# Patient Record
Sex: Male | Born: 1935 | Race: White | Hispanic: No | Marital: Married | Smoking: Never smoker
Health system: Southern US, Community
[De-identification: ages and names within clinical notes are randomized; demographics above are authoritative.]

## PROBLEM LIST (undated history)

## (undated) DIAGNOSIS — I10 Essential (primary) hypertension: Secondary | ICD-10-CM

## (undated) HISTORY — PX: APPENDECTOMY: SHX54

## (undated) HISTORY — PX: EYE SURGERY: SHX253

---

## 2014-01-09 ENCOUNTER — Emergency Department (HOSPITAL_COMMUNITY): Payer: Medicare Other

## 2014-01-09 ENCOUNTER — Encounter (HOSPITAL_COMMUNITY)
Admission: EM | Disposition: A | Discharge status: Home / Self Care | Payer: Self-pay | Source: Home / Self Care | Attending: Neurological Surgery

## 2014-01-09 ENCOUNTER — Inpatient Hospital Stay (HOSPITAL_COMMUNITY)
Admission: EM | Admit: 2014-01-09 | Discharge: 2014-01-11 | DRG: 520 | Disposition: A | Discharge status: Home / Self Care | Payer: Medicare Other | Attending: Neurological Surgery | Admitting: Neurological Surgery

## 2014-01-09 ENCOUNTER — Inpatient Hospital Stay: Admit: 2014-01-09 | Payer: Self-pay | Admitting: Neurological Surgery

## 2014-01-09 ENCOUNTER — Emergency Department (HOSPITAL_COMMUNITY): Payer: Medicare Other | Admitting: Anesthesiology

## 2014-01-09 ENCOUNTER — Encounter (HOSPITAL_COMMUNITY): Payer: Medicare Other | Admitting: Anesthesiology

## 2014-01-09 ENCOUNTER — Encounter (HOSPITAL_COMMUNITY): Payer: Self-pay | Admitting: Emergency Medicine

## 2014-01-09 DIAGNOSIS — M5416 Radiculopathy, lumbar region: Secondary | ICD-10-CM

## 2014-01-09 DIAGNOSIS — IMO0002 Reserved for concepts with insufficient information to code with codable children: Secondary | ICD-10-CM

## 2014-01-09 DIAGNOSIS — Z79899 Other long term (current) drug therapy: Secondary | ICD-10-CM

## 2014-01-09 DIAGNOSIS — M4800 Spinal stenosis, site unspecified: Secondary | ICD-10-CM

## 2014-01-09 DIAGNOSIS — I10 Essential (primary) hypertension: Secondary | ICD-10-CM | POA: Diagnosis present

## 2014-01-09 DIAGNOSIS — M5126 Other intervertebral disc displacement, lumbar region: Principal | ICD-10-CM | POA: Diagnosis present

## 2014-01-09 DIAGNOSIS — R29898 Other symptoms and signs involving the musculoskeletal system: Secondary | ICD-10-CM | POA: Diagnosis present

## 2014-01-09 DIAGNOSIS — Z9889 Other specified postprocedural states: Secondary | ICD-10-CM

## 2014-01-09 HISTORY — PX: LUMBAR LAMINECTOMY/DECOMPRESSION MICRODISCECTOMY: SHX5026

## 2014-01-09 HISTORY — DX: Essential (primary) hypertension: I10

## 2014-01-09 LAB — URINALYSIS, ROUTINE W REFLEX MICROSCOPIC
Bilirubin Urine: NEGATIVE
GLUCOSE, UA: NEGATIVE mg/dL
Hgb urine dipstick: NEGATIVE
KETONES UR: NEGATIVE mg/dL
NITRITE: POSITIVE — AB
Protein, ur: NEGATIVE mg/dL
Specific Gravity, Urine: 1.008 (ref 1.005–1.030)
UROBILINOGEN UA: 0.2 mg/dL (ref 0.0–1.0)
pH: 7 (ref 5.0–8.0)

## 2014-01-09 LAB — CBC
HCT: 45.5 % (ref 39.0–52.0)
Hemoglobin: 15.6 g/dL (ref 13.0–17.0)
MCH: 31.4 pg (ref 26.0–34.0)
MCHC: 34.3 g/dL (ref 30.0–36.0)
MCV: 91.5 fL (ref 78.0–100.0)
Platelets: 158 10*3/uL (ref 150–400)
RBC: 4.97 MIL/uL (ref 4.22–5.81)
RDW: 13.1 % (ref 11.5–15.5)
WBC: 8.4 10*3/uL (ref 4.0–10.5)

## 2014-01-09 LAB — BASIC METABOLIC PANEL
BUN: 19 mg/dL (ref 6–23)
CHLORIDE: 103 meq/L (ref 96–112)
CO2: 25 mEq/L (ref 19–32)
Calcium: 9.3 mg/dL (ref 8.4–10.5)
Creatinine, Ser: 0.94 mg/dL (ref 0.50–1.35)
GFR, EST AFRICAN AMERICAN: 89 mL/min — AB (ref >90)
GFR, EST NON AFRICAN AMERICAN: 77 mL/min — AB (ref >90)
Glucose, Bld: 112 mg/dL — ABNORMAL HIGH (ref 70–99)
POTASSIUM: 4.8 meq/L (ref 3.7–5.3)
SODIUM: 143 meq/L (ref 137–147)

## 2014-01-09 LAB — URINE MICROSCOPIC-ADD ON

## 2014-01-09 LAB — I-STAT TROPONIN, ED: TROPONIN I, POC: 0 ng/mL (ref 0.00–0.08)

## 2014-01-09 SURGERY — LUMBAR LAMINECTOMY/DECOMPRESSION MICRODISCECTOMY 2 LEVELS
Anesthesia: General | Site: Back | Laterality: Bilateral

## 2014-01-09 MED ORDER — CEFTRIAXONE SODIUM 1 G IJ SOLR
1.0000 g | Freq: Once | INTRAMUSCULAR | Status: AC
  Administered 2014-01-09: 1 g via INTRAMUSCULAR
  Filled 2014-01-09: qty 10

## 2014-01-09 MED ORDER — LIDOCAINE HCL (PF) 1 % IJ SOLN
INTRAMUSCULAR | Status: AC
  Administered 2014-01-09: 2.1 mL
  Filled 2014-01-09: qty 5

## 2014-01-09 MED ORDER — ACETAMINOPHEN 650 MG RE SUPP: 650.0000 mg | RECTAL | Status: DC | PRN

## 2014-01-09 MED ORDER — EPHEDRINE SULFATE 50 MG/ML IJ SOLN
INTRAMUSCULAR | Status: DC | PRN
  Administered 2014-01-09: 5 mg via INTRAVENOUS
  Administered 2014-01-09: 10 mg via INTRAVENOUS

## 2014-01-09 MED ORDER — LIDOCAINE HCL (CARDIAC) 20 MG/ML IV SOLN
INTRAVENOUS | Status: DC | PRN
  Administered 2014-01-09: 70 mg via INTRAVENOUS

## 2014-01-09 MED ORDER — LIDOCAINE HCL (CARDIAC) 20 MG/ML IV SOLN
INTRAVENOUS | Status: AC
  Filled 2014-01-09: qty 5

## 2014-01-09 MED ORDER — 0.9 % SODIUM CHLORIDE (POUR BTL) OPTIME
TOPICAL | Status: DC | PRN
  Administered 2014-01-09: 1000 mL

## 2014-01-09 MED ORDER — SODIUM CHLORIDE 0.9 % IV SOLN: 250.0000 mL | INTRAVENOUS | Status: DC

## 2014-01-09 MED ORDER — SODIUM CHLORIDE 0.9 % IJ SOLN
3.0000 mL | Freq: Two times a day (BID) | INTRAMUSCULAR | Status: DC
  Administered 2014-01-09 – 2014-01-11 (×3): 3 mL via INTRAVENOUS

## 2014-01-09 MED ORDER — PHENOL 1.4 % MT LIQD: 1.0000 | OROMUCOSAL | Status: DC | PRN

## 2014-01-09 MED ORDER — GLYCOPYRROLATE 0.2 MG/ML IJ SOLN
INTRAMUSCULAR | Status: AC
  Filled 2014-01-09: qty 5

## 2014-01-09 MED ORDER — BUPIVACAINE HCL (PF) 0.25 % IJ SOLN
INTRAMUSCULAR | Status: DC | PRN
  Administered 2014-01-09: 7 mL

## 2014-01-09 MED ORDER — ONDANSETRON HCL 4 MG/2ML IJ SOLN
INTRAMUSCULAR | Status: DC | PRN
  Administered 2014-01-09: 4 mg via INTRAVENOUS

## 2014-01-09 MED ORDER — HYDROCODONE-ACETAMINOPHEN 5-325 MG PO TABS
1.0000 | ORAL_TABLET | ORAL | Status: DC | PRN
  Administered 2014-01-10 – 2014-01-11 (×6): 1 via ORAL
  Filled 2014-01-09 (×6): qty 1

## 2014-01-09 MED ORDER — SODIUM CHLORIDE 0.9 % IV SOLN
INTRAVENOUS | Status: DC | PRN
  Administered 2014-01-09: 19:00:00 via INTRAVENOUS

## 2014-01-09 MED ORDER — ONDANSETRON HCL 4 MG/2ML IJ SOLN: 4.0000 mg | INTRAMUSCULAR | Status: DC | PRN

## 2014-01-09 MED ORDER — MENTHOL 3 MG MT LOZG: 1.0000 | LOZENGE | OROMUCOSAL | Status: DC | PRN

## 2014-01-09 MED ORDER — FENTANYL CITRATE 0.05 MG/ML IJ SOLN
INTRAMUSCULAR | Status: DC | PRN
  Administered 2014-01-09: 100 ug via INTRAVENOUS
  Administered 2014-01-09: 50 ug via INTRAVENOUS
  Administered 2014-01-09: 150 ug via INTRAVENOUS
  Administered 2014-01-09 (×4): 50 ug via INTRAVENOUS

## 2014-01-09 MED ORDER — PROMETHAZINE HCL 25 MG/ML IJ SOLN: 6.2500 mg | INTRAMUSCULAR | Status: DC | PRN

## 2014-01-09 MED ORDER — PROPOFOL 10 MG/ML IV BOLUS
INTRAVENOUS | Status: AC
  Filled 2014-01-09: qty 20

## 2014-01-09 MED ORDER — ACETAMINOPHEN 325 MG PO TABS: 650.0000 mg | ORAL_TABLET | ORAL | Status: DC | PRN

## 2014-01-09 MED ORDER — CEFAZOLIN SODIUM 1-5 GM-% IV SOLN
1.0000 g | Freq: Three times a day (TID) | INTRAVENOUS | Status: AC
  Administered 2014-01-09 – 2014-01-10 (×2): 1 g via INTRAVENOUS
  Filled 2014-01-09 (×2): qty 50

## 2014-01-09 MED ORDER — NEOSTIGMINE METHYLSULFATE 10 MG/10ML IV SOLN
INTRAVENOUS | Status: DC | PRN
  Administered 2014-01-09: 5 mg via INTRAVENOUS

## 2014-01-09 MED ORDER — METHOCARBAMOL 1000 MG/10ML IJ SOLN
500.0000 mg | Freq: Four times a day (QID) | INTRAMUSCULAR | Status: DC | PRN
  Filled 2014-01-09: qty 5

## 2014-01-09 MED ORDER — DEXAMETHASONE SODIUM PHOSPHATE 10 MG/ML IJ SOLN
INTRAMUSCULAR | Status: AC
  Filled 2014-01-09: qty 1

## 2014-01-09 MED ORDER — FENTANYL CITRATE 0.05 MG/ML IJ SOLN: INTRAMUSCULAR | Status: DC | PRN

## 2014-01-09 MED ORDER — PROPOFOL 10 MG/ML IV BOLUS
INTRAVENOUS | Status: DC | PRN
  Administered 2014-01-09: 180 mg via INTRAVENOUS

## 2014-01-09 MED ORDER — ARTIFICIAL TEARS OP OINT
TOPICAL_OINTMENT | OPHTHALMIC | Status: AC
  Filled 2014-01-09: qty 3.5

## 2014-01-09 MED ORDER — CEFAZOLIN SODIUM-DEXTROSE 2-3 GM-% IV SOLR
INTRAVENOUS | Status: AC
  Filled 2014-01-09: qty 50

## 2014-01-09 MED ORDER — DEXAMETHASONE SODIUM PHOSPHATE 4 MG/ML IJ SOLN
INTRAMUSCULAR | Status: DC | PRN
  Administered 2014-01-09: 8 mg via INTRAVENOUS

## 2014-01-09 MED ORDER — VECURONIUM BROMIDE 10 MG IV SOLR
INTRAVENOUS | Status: AC
  Filled 2014-01-09: qty 20

## 2014-01-09 MED ORDER — THROMBIN 20000 UNITS EX KIT
PACK | CUTANEOUS | Status: DC | PRN
  Administered 2014-01-09: 19:00:00 via TOPICAL

## 2014-01-09 MED ORDER — FENTANYL CITRATE 0.05 MG/ML IJ SOLN
INTRAMUSCULAR | Status: AC
  Filled 2014-01-09: qty 5

## 2014-01-09 MED ORDER — HYDROMORPHONE HCL PF 1 MG/ML IJ SOLN: 0.2500 mg | INTRAMUSCULAR | Status: DC | PRN

## 2014-01-09 MED ORDER — ALBUMIN HUMAN 5 % IV SOLN
INTRAVENOUS | Status: DC | PRN
  Administered 2014-01-09 (×2): via INTRAVENOUS

## 2014-01-09 MED ORDER — CEFAZOLIN SODIUM-DEXTROSE 2-3 GM-% IV SOLR
INTRAVENOUS | Status: DC | PRN
  Administered 2014-01-09: 2 g via INTRAVENOUS

## 2014-01-09 MED ORDER — ENALAPRIL MALEATE 5 MG PO TABS
5.0000 mg | ORAL_TABLET | Freq: Every day | ORAL | Status: DC
  Administered 2014-01-10 – 2014-01-11 (×2): 5 mg via ORAL
  Filled 2014-01-09 (×2): qty 1

## 2014-01-09 MED ORDER — LACTATED RINGERS IV SOLN
INTRAVENOUS | Status: DC | PRN
  Administered 2014-01-09: 20:00:00 via INTRAVENOUS

## 2014-01-09 MED ORDER — STERILE WATER FOR INJECTION IJ SOLN
INTRAMUSCULAR | Status: AC
  Filled 2014-01-09: qty 10

## 2014-01-09 MED ORDER — LACTATED RINGERS IV SOLN: INTRAVENOUS | Status: DC | PRN

## 2014-01-09 MED ORDER — MORPHINE SULFATE 2 MG/ML IJ SOLN: 1.0000 mg | INTRAMUSCULAR | Status: DC | PRN

## 2014-01-09 MED ORDER — DEXAMETHASONE 4 MG PO TABS
4.0000 mg | ORAL_TABLET | Freq: Four times a day (QID) | ORAL | Status: DC
  Administered 2014-01-10 – 2014-01-11 (×5): 4 mg via ORAL
  Filled 2014-01-09 (×10): qty 1

## 2014-01-09 MED ORDER — ONDANSETRON HCL 4 MG/2ML IJ SOLN
INTRAMUSCULAR | Status: AC
  Filled 2014-01-09: qty 2

## 2014-01-09 MED ORDER — SODIUM CHLORIDE 0.9 % IJ SOLN: 3.0000 mL | INTRAMUSCULAR | Status: DC | PRN

## 2014-01-09 MED ORDER — METHOCARBAMOL 500 MG PO TABS: 500.0000 mg | ORAL_TABLET | Freq: Four times a day (QID) | ORAL | Status: DC | PRN

## 2014-01-09 MED ORDER — DEXTROSE 5 % IV SOLN
INTRAVENOUS | Status: DC | PRN
  Administered 2014-01-09: 19:00:00 via INTRAVENOUS

## 2014-01-09 MED ORDER — OXYCODONE HCL 5 MG PO TABS: 5.0000 mg | ORAL_TABLET | Freq: Once | ORAL | Status: DC | PRN

## 2014-01-09 MED ORDER — GLYCOPYRROLATE 0.2 MG/ML IJ SOLN
INTRAMUSCULAR | Status: DC | PRN
  Administered 2014-01-09: 0.2 mg via INTRAVENOUS
  Administered 2014-01-09: 0.6 mg via INTRAVENOUS

## 2014-01-09 MED ORDER — ARTIFICIAL TEARS OP OINT
TOPICAL_OINTMENT | OPHTHALMIC | Status: DC | PRN
  Administered 2014-01-09: 1 via OPHTHALMIC

## 2014-01-09 MED ORDER — OXYCODONE HCL 5 MG/5ML PO SOLN: 5.0000 mg | Freq: Once | ORAL | Status: DC | PRN

## 2014-01-09 MED ORDER — NEOSTIGMINE METHYLSULFATE 10 MG/10ML IV SOLN
INTRAVENOUS | Status: AC
  Filled 2014-01-09: qty 2

## 2014-01-09 MED ORDER — THROMBIN 5000 UNITS EX SOLR
OROMUCOSAL | Status: DC | PRN
  Administered 2014-01-09: 20:00:00 via TOPICAL

## 2014-01-09 MED ORDER — VECURONIUM BROMIDE 10 MG IV SOLR
INTRAVENOUS | Status: DC | PRN
  Administered 2014-01-09: 1 mg via INTRAVENOUS
  Administered 2014-01-09: 7 mg via INTRAVENOUS
  Administered 2014-01-09: 3 mg via INTRAVENOUS

## 2014-01-09 MED ORDER — DEXAMETHASONE SODIUM PHOSPHATE 4 MG/ML IJ SOLN
4.0000 mg | Freq: Four times a day (QID) | INTRAMUSCULAR | Status: DC
  Administered 2014-01-10 (×2): 4 mg via INTRAVENOUS
  Filled 2014-01-09 (×9): qty 1

## 2014-01-09 MED ORDER — POTASSIUM CHLORIDE IN NACL 20-0.9 MEQ/L-% IV SOLN
INTRAVENOUS | Status: DC
  Administered 2014-01-09: 23:00:00 via INTRAVENOUS
  Filled 2014-01-09 (×4): qty 1000

## 2014-01-09 MED ORDER — SODIUM CHLORIDE 0.9 % IR SOLN
Status: DC | PRN
  Administered 2014-01-09: 19:00:00

## 2014-01-09 SURGICAL SUPPLY — 50 items
BAG DECANTER FOR FLEXI CONT (MISCELLANEOUS) ×3 IMPLANT
BENZOIN TINCTURE PRP APPL 2/3 (GAUZE/BANDAGES/DRESSINGS) ×3 IMPLANT
BLADE 10 SAFETY STRL DISP (BLADE) ×3 IMPLANT
BUR MATCHSTICK NEURO 3.0 LAGG (BURR) ×3 IMPLANT
CANISTER SUCT 3000ML (MISCELLANEOUS) ×3 IMPLANT
CLOSURE WOUND 1/2 X4 (GAUZE/BANDAGES/DRESSINGS) ×1
CONT SPEC 4OZ CLIKSEAL STRL BL (MISCELLANEOUS) ×3 IMPLANT
DRAPE LAPAROTOMY 100X72X124 (DRAPES) ×3 IMPLANT
DRAPE MICROSCOPE LEICA (MISCELLANEOUS) ×3 IMPLANT
DRAPE MICROSCOPE ZEISS OPMI (DRAPES) IMPLANT
DRAPE POUCH INSTRU U-SHP 10X18 (DRAPES) ×3 IMPLANT
DRAPE SURG 17X23 STRL (DRAPES) ×3 IMPLANT
DRESSING TELFA 8X3 (GAUZE/BANDAGES/DRESSINGS) ×3 IMPLANT
DRSG OPSITE 4X5.5 SM (GAUZE/BANDAGES/DRESSINGS) ×3 IMPLANT
DRSG OPSITE POSTOP 4X6 (GAUZE/BANDAGES/DRESSINGS) ×3 IMPLANT
DURAPREP 26ML APPLICATOR (WOUND CARE) ×3 IMPLANT
ELECT REM PT RETURN 9FT ADLT (ELECTROSURGICAL) ×3
ELECTRODE REM PT RTRN 9FT ADLT (ELECTROSURGICAL) ×1 IMPLANT
GAUZE SPONGE 4X4 16PLY XRAY LF (GAUZE/BANDAGES/DRESSINGS) IMPLANT
GLOVE BIO SURGEON STRL SZ 6.5 (GLOVE) ×2 IMPLANT
GLOVE BIO SURGEON STRL SZ7 (GLOVE) ×3 IMPLANT
GLOVE BIO SURGEON STRL SZ8 (GLOVE) ×3 IMPLANT
GLOVE BIO SURGEONS STRL SZ 6.5 (GLOVE) ×1
GLOVE INDICATOR 7.5 STRL GRN (GLOVE) ×3 IMPLANT
GOWN BRE IMP SLV AUR LG STRL (GOWN DISPOSABLE) IMPLANT
GOWN BRE IMP SLV AUR XL STRL (GOWN DISPOSABLE) ×3 IMPLANT
GOWN SPEC L4 XLG W/TWL (GOWN DISPOSABLE) ×3 IMPLANT
GOWN STRL REIN 2XL LVL4 (GOWN DISPOSABLE) IMPLANT
GOWN STRL REUS W/ TWL LRG LVL3 (GOWN DISPOSABLE) ×1 IMPLANT
GOWN STRL REUS W/TWL LRG LVL3 (GOWN DISPOSABLE) ×2
HEMOSTAT POWDER KIT SURGIFOAM (HEMOSTASIS) IMPLANT
KIT BASIN OR (CUSTOM PROCEDURE TRAY) ×3 IMPLANT
KIT ROOM TURNOVER OR (KITS) ×3 IMPLANT
NEEDLE HYPO 25X1 1.5 SAFETY (NEEDLE) ×3 IMPLANT
NEEDLE SPNL 20GX3.5 QUINCKE YW (NEEDLE) IMPLANT
NS IRRIG 1000ML POUR BTL (IV SOLUTION) ×3 IMPLANT
PACK LAMINECTOMY NEURO (CUSTOM PROCEDURE TRAY) ×3 IMPLANT
PAD ARMBOARD 7.5X6 YLW CONV (MISCELLANEOUS) ×9 IMPLANT
RUBBERBAND STERILE (MISCELLANEOUS) ×6 IMPLANT
SPONGE SURGIFOAM ABS GEL SZ50 (HEMOSTASIS) ×3 IMPLANT
STRIP CLOSURE SKIN 1/2X4 (GAUZE/BANDAGES/DRESSINGS) ×2 IMPLANT
SUT VIC AB 0 CT1 18XCR BRD8 (SUTURE) ×1 IMPLANT
SUT VIC AB 0 CT1 8-18 (SUTURE) ×2
SUT VIC AB 2-0 CP2 18 (SUTURE) ×3 IMPLANT
SUT VIC AB 3-0 SH 8-18 (SUTURE) ×3 IMPLANT
SYR 20ML ECCENTRIC (SYRINGE) ×3 IMPLANT
TAPE STRIPS DRAPE STRL (GAUZE/BANDAGES/DRESSINGS) ×3 IMPLANT
TOWEL OR 17X24 6PK STRL BLUE (TOWEL DISPOSABLE) ×3 IMPLANT
TOWEL OR 17X26 10 PK STRL BLUE (TOWEL DISPOSABLE) ×3 IMPLANT
WATER STERILE IRR 1000ML POUR (IV SOLUTION) ×3 IMPLANT

## 2014-01-09 NOTE — ED Notes (Signed)
To MRI

## 2014-01-09 NOTE — Anesthesia Postprocedure Evaluation (Signed)
Anesthesia Post Note  Patient: Joel Anderson  Procedure(s) Performed: Procedure(s) (LRB): LUMBAR LAMINECTOMY/DECOMPRESSION MICRODISCECTOMY 2 LEVELS( L3-4, L4-5 Bilateral) (Bilateral)  Anesthesia type: general  Patient location: PACU  Post pain: Pain level controlled  Post assessment: Patient's Cardiovascular Status Stable  Last Vitals:  Filed Vitals:   01/09/14 2154  BP: 129/70  Pulse: 70  Temp: 36.7 C  Resp: 13    Post vital signs: Reviewed and stable  Level of consciousness: sedated  Complications: No apparent anesthesia complications

## 2014-01-09 NOTE — ED Notes (Signed)
Neuro consult at bedside.

## 2014-01-09 NOTE — Consult Note (Signed)
Subjective: Patient is a 80 y.o. male admitted for severe lumbar stenosis with acute onset of leg weakness. Onset of symptoms was several hours ago, stable since that time.  The pain is rated moderate, and is located at the across the lower back and radiates to legs. The pain is described as aching and occurs intermittently. The symptoms have been progressive. Symptoms are exacerbated by exercise and standing. His symptoms improved with sitting. He has had some chronic leg pain with walking for quite some time. However today he had acute onset of feeling of weakness in his legs. He had difficulty walking because of that. He denies numbness or tingling in the perineum and he denies bowel or bladder changes. He is urinating fine in the emergency department. He walk a mile and a half [place] but cannot walk today. He has some numbness and tingling in the feet when standing but none at rest. MRI or CT showed severe spinal stenosis at L3-4 with a right-sided disc protrusion and early severe lateral recess stenosis at L4-5 with multilevel spondylosis and degenerative disc disease.   Past Medical History  Diagnosis Date  . Hypertension     Past Surgical History  Procedure Laterality Date  . Appendectomy    . Eye surgery      Prior to Admission medications   Medication Sig Start Date End Date Taking? Authorizing Provider  enalapril (VASOTEC) 5 MG tablet Take 5 mg by mouth daily.  12/02/13  Yes Historical Provider, MD  naproxen sodium (ANAPROX) 220 MG tablet Take 440 mg by mouth 2 (two) times daily with a meal.   Yes Historical Provider, MD  trolamine salicylate (ASPERCREME) 10 % cream Apply 1 application topically as needed for muscle pain (right hip).   Yes Historical Provider, MD   No Known Allergies  History  Substance Use Topics  . Smoking status: Never Smoker   . Smokeless tobacco: Not on file  . Alcohol Use: No    History reviewed. No pertinent family history.    Review of Systems  Positive ROS: neg  All other systems have been reviewed and were otherwise negative with the exception of those mentioned in the HPI and as above.  Objective: Vital signs in last 24 hours: Temp:  [98 F (36.7 C)-98.6 F (37 C)] 98.6 F (37 C) (05/02 1609) Pulse Rate:  [65-77] 70 (05/02 1700) Resp:  [14-22] 16 (05/02 1700) BP: (135-165)/(76-101) 147/81 mmHg (05/02 1700) SpO2:  [96 %-100 %] 97 % (05/02 1700)  General Appearance: Alert, cooperative, no distress, appears stated age Head: Normocephalic, without obvious abnormality, atraumatic Eyes: PERRL, conjunctiva/corneas clear, EOM's intact    Neck: Supple, symmetrical, trachea midline Back: Symmetric, no curvature, ROM normal, no CVA tenderness Lungs:  respirations unlabored Heart: Regular rate and rhythm Abdomen: Soft, non-tender Extremities: Extremities normal, atraumatic, no cyanosis or edema Pulses: 2+ and symmetric all extremities Skin: Skin color, texture, turgor normal, no rashes or lesions  NEUROLOGIC:   Mental status: Alert and oriented x4,  no aphasia, good attention span, fund of knowledge, and memory Motor Exam - grossly normal except for bilateral foot drops measuring about 3/5, has normal muscle tone and good proximal leg strength. Plantarflexion may be slightly weak. Sensory Exam - grossly normal Reflexes: 1+, no clonus and toes are downgoing Coordination - somewhat decreased in the lower extremities Gait - unable to adequately assess Balance - not tested Cranial Nerves: I: smell Not tested  II: visual acuity  OS: nl  OD: nl  II: visual fields Full to confrontation  II: pupils Equal, round, reactive to light  III,VII: ptosis None  III,IV,VI: extraocular muscles  Full ROM  V: mastication Normal  V: facial light touch sensation  Normal  V,VII: corneal reflex  Present  VII: facial muscle function - upper  Normal  VII: facial muscle function - lower Normal  VIII: hearing Not tested   IX: soft palate elevation  Normal  IX,X: gag reflex Present  XI: trapezius strength  5/5  XI: sternocleidomastoid strength 5/5  XI: neck flexion strength  5/5  XII: tongue strength  Normal    Data Review Lab Results  Component Value Date   WBC 8.4 01/09/2014   HGB 15.6 01/09/2014   HCT 45.5 01/09/2014   MCV 91.5 01/09/2014   PLT 158 01/09/2014   Lab Results  Component Value Date   NA 143 01/09/2014   K 4.8 01/09/2014   CL 103 01/09/2014   CO2 25 01/09/2014   BUN 19 01/09/2014   CREATININE 0.94 01/09/2014   GLUCOSE 112* 01/09/2014   No results found for this basename: INR, PROTIME    Assessment/Plan: Patient admitted for severe lumbar spinal stenosis at L3-4 and L4-5 with acute onset of leg weakness. I do not think that this represents a cauda equina syndrome. However given his acute onset of weakness and the severity of his stenosis I am recommending decompressive surgery at L3-4 and L4-5.   I explained the condition and procedure to the patient and answered any questions.  Patient wishes to proceed with procedure as planned. Understands risks/ benefits and typical outcomes of procedure. The risks of a procedure include but are not limited to bleeding, infection, CSF leak, numbness, weakness, loss of bowel bladder or sexual function, lack of relief of symptoms, worsening symptoms, vascular injury, need for further surgery, iatrogenic instability, and anesthesia risk including DVT pneumonia MI and death. They agreed to proceed.   Tia AlertDavid S Colbi Staubs 01/09/2014 5:40 PM

## 2014-01-09 NOTE — Progress Notes (Signed)
Received patient from PACU. Patient alert and oriented X4 denies any pain at this time. Honeycomb dressing to back  Top of dressing has some bloody drainage will monitor closely. Family at bedside.

## 2014-01-09 NOTE — ED Provider Notes (Signed)
CSN: [id number]     Arrival date & time 01/09/14  1225 History   First MD Initiated Contact with Patient 01/09/14 1235     Chief Complaint  Patient presents with  . Leg Pain     (Consider location/radiation/quality/duration/timing/severity/associated sxs/prior Treatment) HPI Comments: The patient is a 80 year old male with past medical history of hypertension, presenting to the emergency department with a Chief complaint of bilateral lower extremity numbness and ataxia at approximately 10:30 this morning.  The patient reports upon standing he felt uncoordinated, "my legs gave out".  He denies falling. Aggravating factors include walking.  Relieving factors sitting. He reports left hip pain for several days and [place] He reports bilateral hip discomfort with laying on his side. Relief by lying on his back.  Patient is a 80 y.o. male presenting with leg pain. The history is provided by the patient. No language interpreter was used.  Leg Pain Associated symptoms: no back pain and no fever     Past Medical History  Diagnosis Date  . Hypertension    Past Surgical History  Procedure Laterality Date  . Appendectomy    . Eye surgery     History reviewed. No pertinent family history. History  Substance Use Topics  . Smoking status: Never Smoker   . Smokeless tobacco: Not on file  . Alcohol Use: No    Review of Systems  Constitutional: Negative for fever and chills.  Respiratory: Negative for shortness of breath.   Cardiovascular: Negative for chest pain.  Gastrointestinal: Negative for abdominal pain.  Musculoskeletal: Positive for arthralgias and gait problem. Negative for back pain.  Neurological: Positive for weakness and numbness. Negative for syncope, light-headedness and headaches.      Allergies  Review of patient's allergies indicates no known allergies.  Home Medications  enalapril (VASOTEC) 5 MG tablet naproxen sodium  (ANAPROX) 220 MG tablet   BP 158/101  Pulse 75  Temp(Src) 98 F (36.7 C) (Oral)  Resp 18  SpO2 99% Physical Exam  Nursing note and vitals reviewed. Constitutional: He appears well-developed and well-nourished.  Non-toxic appearance. He does not have a sickly appearance. No distress.  HENT:  Head: Normocephalic.  Eyes: EOM are normal. Pupils are equal, round, and reactive to light.  Left pupil with defect.  Neck: Normal range of motion. Neck supple.  Cardiovascular: Normal rate and regular rhythm.   No lower extremity edema  Pulmonary/Chest: Effort normal and breath sounds normal. No respiratory distress. He has no wheezes. He has no rales.  Abdominal: Soft. Bowel sounds are normal. He exhibits no distension. There is no tenderness. There is no rebound and no guarding.  Musculoskeletal: Normal range of motion. He exhibits no edema and no tenderness.  Neurological: He is alert. No cranial nerve deficit or sensory deficit. He exhibits normal muscle tone. GCS eye subscore is 4. GCS verbal subscore is 5. GCS motor subscore is 6.  Disoriented to month, December 10, 2013. Oriented to person and place. Speech is clear and goal oriented, follows commands Cranial nerves III - XII grossly intact, no facial droop Normal strength in upper and lower extremities bilaterally, strong and equal grip strength Sensation normal to light touch Moves all 4 extremities without ataxia, coordination intact Normal finger to nose and rapid alternating movements. Normal heel to shin. No pronator drift  Skin: Skin is warm and dry. No rash noted. He is not diaphoretic. No erythema.  Psychiatric: He has a normal mood and affect.  His behavior is normal. Thought content normal.    ED Course  Procedures (including critical care time) Labs Review Labs Reviewed  BASIC METABOLIC PANEL - Abnormal; Notable for the following:    Glucose, Bld 112 (*)    GFR calc non Af Amer 77 (*)    GFR calc Af Amer 89 (*)    All  other components within normal limits  URINALYSIS, ROUTINE W REFLEX MICROSCOPIC - Abnormal; Notable for the following:    APPearance CLOUDY (*)    Nitrite POSITIVE (*)    Leukocytes, UA SMALL (*)    All other components within normal limits  URINE MICROSCOPIC-ADD ON - Abnormal; Notable for the following:    Bacteria, UA MANY (*)    All other components within normal limits  URINE CULTURE  CBC  I-STAT TROPOININ, ED    Imaging Review Ct Head Wo Contrast  01/09/2014   CLINICAL DATA:  Difficulty walking.  Weakness.  EXAM: CT HEAD WITHOUT CONTRAST  TECHNIQUE: Contiguous axial images were obtained from the base of the skull through the vertex without intravenous contrast.  COMPARISON:  None.  FINDINGS: Diffuse cerebral atrophy is present. No mass. No hydrocephalus. No hemorrhage. Orbits are intact. No acute bony abnormality.  IMPRESSION: Diffuse cerebral atrophy, no acute abnormality.   Electronically Signed   By: Maisie Fushomas  Register   On: 01/09/2014 14:21   Mr Lumbar Spine Wo Contrast  01/09/2014   CLINICAL DATA:  Low back pain with bilateral hip and thigh pain. Possible spinal stenosis.  EXAM: MRI LUMBAR SPINE WITHOUT CONTRAST  TECHNIQUE: Multiplanar, multisequence MR imaging of the lumbar spine was performed. No intravenous contrast was administered.  COMPARISON:  CT head obtained earlier today was unremarkable.  FINDINGS: Anatomic alignment. No worrisome osseous lesions.Moderate to severe disc space narrowing at all levels, probably worst at L5-S1. Endplate reactive changes reflect chronic loss of interspace height at L3-4, L4-5, and L5-S1. Normal conus ending upper aspect of L1. No paravertebral or retroperitoneal masses.  The individual disc spaces were examined with axial images as follows:  L1-L2: Mild annular bulging. Mild facet arthropathy. No impingement.  L2-L3: Mild annular bulging. Mild facet arthropathy. No impingement.  L3-L4: Central disc extrusion slightly eccentric to the right, associated  with facet arthropathy and ligamentum flavum hypertrophy. Critical spinal stenosis is present with only 2-3 mm of measurable Canal diameter. Significant compression of the cauda equina nerve roots. Bilateral foraminal narrowing may be slightly worse on the left. Bilateral L3 and L4 nerve root impingement is noted.  L4-L5: Central disc protrusion with moderate facet and ligamentum flavum hypertrophy. Moderate to severe central canal stenosis. Bilateral L5 and L4 nerve root impingement, may be slightly worse on the right.  L5-S1: Mild bulge. Mild osteophytic spurring. Mild facet arthropathy. No S1 nerve root impingement in the canal. Bilateral neural foraminal narrowing is worse on the left.  IMPRESSION: Critical spinal stenosis at L3-L4. Significant compression of the cauda equina nerve roots is observed, slightly worse on the right. Surgical consultation with regard to decompression may be warranted, in the setting of leg weakness.  Moderate to severe stenosis at L4-5.  Bilateral foraminal narrowing can be seen from L3 through S1, multifactorial in nature, related to disc material, posterior element hypertrophy, and disc space narrowing; see comments above.   Electronically Signed   By: Davonna BellingJohn  Curnes M.D.   On: 01/09/2014 16:32     EKG Interpretation None      MDM   Final diagnoses:  Lumbar radiculopathy  Spinal stenosis  UTI No neurologic deficits on exam. I did not walk the patient or have him stand.  Normal heel shin, finger-nose. Dr. Rubin Payor also evaluated the patient in the emergency department, he reports he was able to stand but was unable to walk unassisted due to ataxia.  Consult to neurologist. Awaiting MRI results. MRI shows: Critical spinal stenosis at L3-L4. Significant compression of the cauda equina nerve roots is observed, slightly worse on the right.   Re-eval: Pt resting comfortably in room.  Discussed spinal stenosis MRI findings with the patient and patient's wife and son,  discussed need for Neurosurgery consult. Dr. Yetta Barre to evaluate the patient in the ED.  Dr. Yetta Barre to admit the patient. UA shows infection, will initiate treatment in the ED. Urine culture sent     Clabe Seal, PA-C 01/10/14 1724

## 2014-01-09 NOTE — Anesthesia Procedure Notes (Signed)
Procedure Name: Intubation Date/Time: 01/09/2014 6:49 PM Performed by: Wray KearnsFOLEY, Neetu Carrozza A Pre-anesthesia Checklist: Patient identified, Timeout performed, Emergency Drugs available, Suction available and Patient being monitored Patient Re-evaluated:Patient Re-evaluated prior to inductionOxygen Delivery Method: Circle system utilized Preoxygenation: Pre-oxygenation with 100% oxygen Intubation Type: IV induction and Cricoid Pressure applied Ventilation: Mask ventilation without difficulty Laryngoscope Size: Mac and 4 Grade View: Grade II Tube type: Oral Tube size: 8.0 mm Number of attempts: 1 Airway Equipment and Method: Stylet Placement Confirmation: ETT inserted through vocal cords under direct vision,  breath sounds checked- equal and bilateral and positive ETCO2 Secured at: 23 cm Tube secured with: Tape Dental Injury: Teeth and Oropharynx as per pre-operative assessment

## 2014-01-09 NOTE — Op Note (Signed)
01/09/2014  9:15 PM  PATIENT:  Joel Anderson  80 y.o. male  PRE-OPERATIVE DIAGNOSIS:  Severe lumbar spinal stenosis L3-4 and L4-5 with lumbar disc herniation L3-4, acute onset of bilateral leg weakness, leg pain  POST-OPERATIVE DIAGNOSIS:  Same  PROCEDURE:  Decompressive lumbar hemilaminectomy, medial facetectomy and foraminotomies L3-4 and L4-5 bilaterally followed by microdiscectomy L3-4 on the right utilizing microdissection  SURGEON:  Marikay Alaravid Etienne Mowers, MD  ASSISTANTS: None  ANESTHESIA:   General  EBL: 150 ml  Total I/O In: 1050 [I.V.:550; IV Piggyback:500] Out: 150 [Blood:150]  BLOOD ADMINISTERED:none  DRAINS:  none    SPECIMEN:  No Specimen  INDICATION FOR PROCEDURE:  this patient presented with acute onset of bilateral leg weakness. He had a history of progressive leg pain with ambulation. He presents MRSA department today where MRI showed severe spinal stenosis at L3-4 L4-5 with a disc herniation at L3-4 the right. I recommended a decompression and discectomy . Patient understood the risks, benefits, and alternatives and potential outcomes and wished to proceed.  PROCEDURE DETAILS: The patient was taken to the operating room and after induction of adequate generalized endotracheal anesthesia, the patient was rolled into the prone position on the Wilson frame and all pressure points were padded. The lumbar region was cleaned and then prepped with DuraPrep and draped in the usual sterile fashion. 5 cc of local anesthesia was injected and then a dorsal midline incision was made and carried down to the lumbo sacral fascia. The fascia was opened and the paraspinous musculature was taken down in a subperiosteal fashion to expose L3-4 and L4-5 bilaterally. Intraoperative x-ray confirmed my level, and then I used a combination of the high-speed drill and the Kerrison punches to perform a hemilaminectomy, medial facetectomy, and foraminotomy at L34 and L4-5 bilaterally. The underlying yellow  ligament was opened and removed in a piecemeal fashion to expose the underlying dura and exiting nerve root at each level bilaterally. I undercut the lateral recess and dissected down until I was medial to and distal to the pedicle at each level. There was severe lateral recess stenosis at L4-5 bilaterally. There was severe central stenosis and lateral recess stenosis L3-4. The nerve root was well decompressed. We then gently retracted the nerve root medially with a retractor, coagulated the epidural venous vasculature, and incised the disc space at L3-4 on the right. Utilizing microdissection, I performed a thorough intradiscal discectomy with pituitary rongeurs and curettes, until I had a nice decompression of the nerve root and the midline. I then palpated with a coronary dilator along each nerve root and into the foramen to assure adequate decompression. I felt no more compression of the nerve root at either L3-4 L4-5. I irrigated with saline solution containing bacitracin. Achieved hemostasis with bipolar cautery, lined the dura with Gelfoam, and then closed the fascia with 0 Vicryl. I closed the subcutaneous tissues with 2-0 Vicryl and the subcuticular tissues with 3-0 Vicryl. The skin was then closed with benzoin and Steri-Strips. The drapes were removed, a sterile dressing was applied. The patient was awakened from general anesthesia and transferred to the recovery room in stable condition. At the end of the procedure all sponge, needle and instrument counts were correct.   PLAN OF CARE: Admit to inpatient   PATIENT DISPOSITION:  PACU - hemodynamically stable.   Delay start of Pharmacological VTE agent (>24hrs) due to surgical blood loss or risk of bleeding:  yes

## 2014-01-09 NOTE — Transfer of Care (Signed)
Immediate Anesthesia Transfer of Care Note  Patient: Joel Anderson  Procedure(s) Performed: Procedure(s): LUMBAR LAMINECTOMY/DECOMPRESSION MICRODISCECTOMY 2 LEVELS( L3-4, L4-5 Bilateral) (Bilateral)  Patient Location: PACU  Anesthesia Type:General  Level of Consciousness: oriented, sedated, patient cooperative and responds to stimulation  Airway & Oxygen Therapy: Patient Spontanous Breathing and Patient connected to nasal cannula oxygen  Post-op Assessment: Report given to PACU RN, Post -op Vital signs reviewed and stable, Patient moving all extremities and Patient moving all extremities X 4  Post vital signs: Reviewed and stable  Complications: No apparent anesthesia complications

## 2014-01-09 NOTE — Consult Note (Signed)
Reason for Consult: Bilateral lower extremity weakness Referring Physician: Dr Rubin Payor  CC: Weakness - difficulty walking  HPI: Joel Anderson is an 80 y.o. male who stays fairly active walking about a mile and 1/2  Three to five days per week. Today, 01/09/2014, after getting out of the shower he felt as if his balance was off. He seemed to have difficulty walking. He described "weakness" in his hips. He had been experiencing right hip/buttocks pain for several weeks especially when he was lying on his right side. For the past day or so he had also noted pain in the same area on his left side when lying on that side. His wife and son decided to bring him to the emergency department today for further evaluation. The patient stated that when he was walking into the hospital his legs felt weak as if there were going to give out. He denies falling.   Two days ago he was walking in the park with his son and felt as if he was having difficulty lifting his left foot. He broke his left ankle approximately 3 years ago and has experienced some minor difficulties in the past. He denies any low back problems other than occasional discomfort if he strains his back working around the house. He does see a chiropractor occasionally for adjustments. His last visit was approximately 2 weeks ago. He feels fine at rest. When he stood beside the stretcher in the ED he felt as if his legs would give out and reported pain in both calves.   Past Medical History  Diagnosis Date  . Hypertension     Past Surgical History  Procedure Laterality Date  . Appendectomy    . Eye surgery      Family History: Both his parents were healthy and lived into their 82s.  Social History:  reports that he has never smoked. He does not have any smokeless tobacco history on file. He reports that he does not drink alcohol or use illicit drugs.  No Known Allergies  Medications:  No current facility-administered medications for this  encounter.   Current Outpatient Prescriptions  Medication Sig Dispense Refill  . enalapril (VASOTEC) 5 MG tablet Take 5 mg by mouth daily.       . naproxen sodium (ANAPROX) 220 MG tablet Take 440 mg by mouth 2 (two) times daily with a meal.      . trolamine salicylate (ASPERCREME) 10 % cream Apply 1 application topically as needed for muscle pain (right hip).       I have reviewed the patient's current medications.  ROS: History obtained from the patient  General ROS: negative for - chills, fatigue, fever, night sweats, weight gain or weight loss Psychological ROS: negative for - behavioral disorder, hallucinations, memory difficulties, mood swings or suicidal ideation Ophthalmic ROS: negative for - blurry vision, double vision, eye pain or loss of vision ENT ROS: negative for - epistaxis, nasal discharge, oral lesions, sore throat, tinnitus or vertigo Allergy and Immunology ROS: negative for - hives or itchy/watery eyes Hematological and Lymphatic ROS: negative for - bleeding problems, bruising or swollen lymph nodes Endocrine ROS: negative for - galactorrhea, hair pattern changes, polydipsia/polyuria or temperature intolerance Respiratory ROS: negative for - cough, hemoptysis, shortness of breath or wheezing Cardiovascular ROS: negative for - chest pain, dyspnea on exertion, edema or irregular heartbeat Gastrointestinal ROS: negative for - abdominal pain, diarrhea, hematemesis, nausea/vomiting or stool incontinence Genito-Urinary ROS: negative for - dysuria, hematuria, incontinence or urinary frequency/urgency Musculoskeletal  ROS: negative for - joint swelling or muscular weakness Neurological ROS: as noted in HPI Dermatological ROS: negative for rash and skin lesion changes   Physical Examination: Blood pressure 158/101, pulse 75, temperature 98 F (36.7 C), temperature source Oral, resp. rate 18, SpO2 99.00%. Mental Status: Alert, oriented, thought content appropriate.  Speech  fluent without evidence of aphasia.  Able to follow 3 step commands without difficulty. Cranial Nerves: II: Discs not visualized; Visual fields grossly normal, pupils equal, round, reactive to light and accommodation III,IV, VI: ptosis not present, extra-ocular motions intact bilaterally V,VII: smile symmetric, facial light touch sensation normal bilaterally VIII: hearing normal bilaterally IX,X: gag reflex present XI: bilateral shoulder shrug XII: midline tongue extension Motor: Moderate bilateral foot drop with anterior tibialis weakness (4-/5), as well as moderate weakness of extensor hallucis longus and extensive digitorum brevis muscles. Tone and bulk:normal tone throughout; no atrophy noted Sensory: Pinprick and light touch intact throughout, bilaterally Deep Tendon Reflexes: 1+ and symmetric throughout Plantars: Right: downgoing   Left: downgoing Cerebellar: normal finger-to-nose, normal rapid alternating movements and normal heel-to-shin test Gait: The patient had difficulty standing with bilateral leg weakness and bilateral calf tenderness.     Laboratory Studies:   Basic Metabolic Panel:  Recent Labs Lab 01/09/14 1238  NA 143  K 4.8  CL 103  CO2 25  GLUCOSE 112*  BUN 19  CREATININE 0.94  CALCIUM 9.3    Liver Function Tests: No results found for this basename: AST, ALT, ALKPHOS, BILITOT, PROT, ALBUMIN,  in the last 168 hours No results found for this basename: LIPASE, AMYLASE,  in the last 168 hours No results found for this basename: AMMONIA,  in the last 168 hours  CBC:  Recent Labs Lab 01/09/14 1238  WBC 8.4  HGB 15.6  HCT 45.5  MCV 91.5  PLT 158    Cardiac Enzymes: No results found for this basename: CKTOTAL, CKMB, CKMBINDEX, TROPONINI,  in the last 168 hours  BNP: No components found with this basename: POCBNP,   CBG: No results found for this basename: GLUCAP,  in the last 168 hours  Microbiology: No results found for this or any  previous visit.  Coagulation Studies: No results found for this basename: LABPROT, INR,  in the last 72 hours  Urinalysis:  Recent Labs Lab 01/09/14 1354  COLORURINE YELLOW  LABSPEC 1.008  PHURINE 7.0  GLUCOSEU NEGATIVE  HGBUR NEGATIVE  BILIRUBINUR NEGATIVE  KETONESUR NEGATIVE  PROTEINUR NEGATIVE  UROBILINOGEN 0.2  NITRITE POSITIVE*  LEUKOCYTESUR SMALL*    Lipid Panel:  No results found for this basename: chol, trig, hdl, cholhdl, vldl, ldlcalc    HgbA1C:  No results found for this basename: HGBA1C    Urine Drug Screen:   No results found for this basename: labopia, cocainscrnur, labbenz, amphetmu, thcu, labbarb    Alcohol Level: No results found for this basename: ETH,  in the last 168 hours  Other results: EKG: Sinus rhythm rate 71 beats per minute. Please see formal reading for full details.   Imaging:  Ct Head Wo Contrast 01/09/2014    Diffuse cerebral atrophy, no acute abnormality.      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 Widespread lumbosacral spinal stenosis cannot be ruled out.  Recommend: 1. Lumbosacral MRI without contrast 2. Neurosurgery or orthopedics consult if acute lumbar radiculopathy is demonstrated on MRI study or patient shows moderate to severe lumbosacral spinal stenosis. 3. Conservative management with anti-inflammatory medication and physical therapy if  MRI study is unremarkable.  Delton Seeavid Rinehuls PA-C [facility] Pager [phone] 01/09/2014, 3:32 PM   I personally participated in this patient's evaluation and management, including examination, as well as formulating the above clinical impression and management recommendations.  Venetia MaxonR Magdelene Ruark M.D. [facility] [phone]

## 2014-01-09 NOTE — ED Notes (Signed)
Rt upper ( near buttocks)  Hip  Walked thru park on Thursday  leg pain over the last couple of weeks  Pain radiates down leg and feet feel heavy No recent injury hurts to lay on that side. Has been going to see chiropractor was fine this am and then all of of a sudden he  Seemed to get weak

## 2014-01-09 NOTE — ED Notes (Addendum)
Pt states his R hip has been hurting for past few days, then today he stood to get up from breakfast table and "my legs just gave out, i thought i might fall, but i didn't get hurt." states hes had trouble walking since.

## 2014-01-09 NOTE — Anesthesia Preprocedure Evaluation (Addendum)
Anesthesia Evaluation  Patient identified by MRN, date of birth, ID band Patient awake    Reviewed: Allergy & Precautions, H&P , NPO status , Patient's Chart, lab work & pertinent test results  Airway Mallampati: II TM Distance: >3 FB Neck ROM: Full    Dental  (+) Teeth Intact, Dental Advisory Given   Pulmonary neg pulmonary ROS,    Pulmonary exam normal       Cardiovascular hypertension,     Neuro/Psych negative neurological ROS  negative psych ROS   GI/Hepatic negative GI ROS, Neg liver ROS,   Endo/Other  negative endocrine ROS  Renal/GU negative Renal ROS     Musculoskeletal   Abdominal   Peds  Hematology   Anesthesia Other Findings   Reproductive/Obstetrics negative OB ROS                       Anesthesia Physical Anesthesia Plan  ASA: III and emergent  Anesthesia Plan: General   Post-op Pain Management:    Induction: Intravenous  Airway Management Planned: Oral ETT  Additional Equipment:   Intra-op Plan:   Post-operative Plan: Extubation in OR  Informed Consent: I have reviewed the patients History and Physical, chart, labs and discussed the procedure including the risks, benefits and alternatives for the proposed anesthesia with the patient or authorized representative who has indicated his/her understanding and acceptance.   Dental advisory given  Plan Discussed with: Anesthesiologist, CRNA and Surgeon  Anesthesia Plan Comments:     Anesthesia Quick Evaluation

## 2014-01-09 NOTE — ED Notes (Signed)
Neuro into see pt family in

## 2014-01-10 NOTE — Evaluation (Signed)
Physical Therapy Evaluation Patient Details Name: Joel Anderson MRN: [id number] DOB: 1931/02/26 Today's Date: 01/10/2014   History of Present Illness  Patient is a 80 y.o. male admitted for severe lumbar stenosis with acute onset of leg weakness.MRI or CT showed severe spinal stenosis at L3-4 with a right-sided disc protrusion and early severe lateral recess stenosis at L4-5 with multilevel spondylosis and degenerative disc disease. Pt is s/p L3-L5 lumbar laminectomy/decompression.   Clinical Impression  Patient is s/p surgery listed above resulting in the deficits listed below (see PT Problem List). Patient will benefit from skilled PT to increase their independence and safety with mobility (while adhering to their precautions) to allow discharge home with wife. Pt hopeful to D/C home tomorrow. Will plan to assess with cane next session. Patient needs to practice stairs next session.        Follow Up Recommendations No PT follow up;Supervision for mobility/OOB    Equipment Recommendations  Other (comment) (cane ? pending progress and mobility tomorrow )    Recommendations for Other Services OT consult     Precautions / Restrictions Precautions Precautions: Back Precaution Booklet Issued: Yes (comment) Precaution Comments: educated on back precautions  Restrictions Weight Bearing Restrictions: No      Mobility  Bed Mobility Overal bed mobility: Needs Assistance Bed Mobility: Rolling;Sidelying to Sit;Sit to Sidelying Rolling: Supervision Sidelying to sit: Supervision     Sit to sidelying: Supervision General bed mobility comments: cues for log rolling technique to adhere to precautions   Transfers Overall transfer level: Needs assistance Equipment used: None Transfers: Sit to/from Stand Sit to Stand: Supervision         General transfer comment: slight sway with standing; cues for sequencing   Ambulation/Gait Ambulation/Gait assistance: Min guard Ambulation Distance  (Feet): 100 Feet Assistive device: None (supported through gt belt ) Gait Pattern/deviations: Step-through pattern;Wide base of support;Drifts right/left;Decreased stride length Gait velocity: guarded due to pain  Gait velocity interpretation: Below normal speed for age/gender General Gait Details: pt slightly unsteady with gt; has tendency to drift when distracted or challenged with high level balance activities; no LOB noted however, min guard to steady   Careers information officertairs            Wheelchair Mobility    Modified Rankin (Stroke Patients Only)       Balance Overall balance assessment: Needs assistance;History of Falls Sitting-balance support: Feet supported;No upper extremity supported Sitting balance-Leahy Scale: Good     Standing balance support: During functional activity;No upper extremity supported Standing balance-Leahy Scale: Fair Standing balance comment: able to accept weightshifts minimally                              Pertinent Vitals/Pain C/o "soreness" at surgical site; no c/o pain     Home Living Family/patient expects to be discharged to:: Private residence Living Arrangements: Spouse/significant other;Children Available Help at Discharge: Family;Available 24 hours/day Type of Home: House Home Access: Stairs to enter Entrance Stairs-Rails: Right;Left;Can reach both Entrance Stairs-Number of Steps: 3 Home Layout: Able to live on main level with bedroom/bathroom Home Equipment: None      Prior Function Level of Independence: Independent         Comments: pt very active; walks 3 miles daily     Hand Dominance        Extremity/Trunk Assessment   Upper Extremity Assessment: Defer to OT evaluation           Lower  Extremity Assessment: Overall WFL for tasks assessed      Cervical / Trunk Assessment: Normal  Communication   Communication: No difficulties  Cognition Arousal/Alertness: Awake/alert Behavior During Therapy: WFL for  tasks assessed/performed Overall Cognitive Status: Within Functional Limits for tasks assessed                      General Comments General comments (skin integrity, edema, etc.): discussed D/C plans at length. may benefit from cane with amblation     Exercises General Exercises - Lower Extremity Ankle Circles/Pumps: AROM;Both;10 reps Straight Leg Raises: AROM;Both;5 reps      Assessment/Plan    PT Assessment Patient needs continued PT services  PT Diagnosis Abnormality of gait;Acute pain   PT Problem List Decreased activity tolerance;Decreased balance;Decreased mobility;Decreased skin integrity  PT Treatment Interventions DME instruction;Gait training;Stair training;Functional mobility training;Therapeutic activities;Therapeutic exercise;Balance training;Neuromuscular re-education;Patient/family education   PT Goals (Current goals can be found in the Care Plan section) Acute Rehab PT Goals Patient Stated Goal: to not have to use a cane and get back to walking  PT Goal Formulation: With patient Time For Goal Achievement: 01/17/14 Potential to Achieve Goals: Good    Frequency Min 5X/week   Barriers to discharge        Co-evaluation               End of Session Equipment Utilized During Treatment: Gait belt Activity Tolerance: Patient tolerated treatment well Patient left: in bed;with call bell/phone within reach;with nursing/sitter in room Nurse Communication: Mobility status;Precautions         Time: [id number] PT Time Calculation (min): 16 min   Charges:   PT Evaluation $Initial PT Evaluation Tier I: 1 Procedure PT Treatments $Gait Training: 8-22 mins   PT G CodesNadara Anderson:          Joel Anderson, [place]PT  [phone] 01/10/2014, 12:53 PM

## 2014-01-10 NOTE — Evaluation (Signed)
Occupational Therapy Evaluation Patient Details Name: Joel Anderson MRN: [id number] DOB: March 24, 1931 Today's Date: 01/10/2014    History of Present Illness Patient is a 80 y.o. male admitted for severe lumbar stenosis with acute onset of leg weakness.Scan showed severe spinal stenosis at L3-4 with a right-sided disc protrusion and early severe lateral recess stenosis at L4-5 with multilevel spondylosis and degenerative disc disease. Pt is s/p L3-L5 lumbar laminectomy/decompression.    Clinical Impression   This 80 yo male independent pta admitted and underwent above presents to acute OT with back precautions thus affecting how pt will need to take care of himself. Pt will benefit from one more session of OT before D/C.    Follow Up Recommendations  No OT follow up    Equipment Recommendations  Tub/shower seat (maybe--TBD (01/11/14))       Precautions / Restrictions Precautions Precautions: Back Precaution Comments: educated on back precautions  Restrictions Weight Bearing Restrictions: No      Mobility Bed Mobility Overal bed mobility: Needs Assistance Bed Mobility: Rolling;Sidelying to Sit;Sit to Sidelying Rolling: Supervision Sidelying to sit: Supervision     Sit to sidelying: Supervision General bed mobility comments: cues for log rolling technique to adhere to precautions   Transfers Overall transfer level: Needs assistance Equipment used: None Transfers: Sit to/from Stand Sit to Stand: Min guard         General transfer comment: does move with a limp from old ankle injury    Balance Overall balance assessment: Needs assistance;History of Falls Sitting-balance support: Feet supported;No upper extremity supported Sitting balance-Leahy Scale: Good     Standing balance support: No upper extremity supported Standing balance-Leahy Scale: Fair                              ADL Overall ADL's : Needs assistance/impaired Eating/Feeding:  Independent;Sitting   Grooming: Set up;Sitting   Upper Body Bathing: Set up;Sitting   Lower Body Bathing: Moderate assistance;Sit to/from stand   Upper Body Dressing : Set up;Sitting   Lower Body Dressing: Total assistance;Sit to/from stand   Toilet Transfer: Surveyor, quantityMin guard;Regular Toilet;Grab bars Toilet Transfer Details (indicate cue type and reason): Pt has a corner of a wall to hold onto at his child's house (that he and his wife were visiting) Toileting- ArchitectClothing Manipulation and Hygiene: Min guard;Sit to/from stand       Functional mobility during ADLs: Min guard                 Pertinent Vitals/Pain No c/o pain     Hand Dominance Right   Extremity/Trunk Assessment Upper Extremity Assessment Upper Extremity Assessment: Overall WFL for tasks assessed           Communication Communication Communication: No difficulties   Cognition Arousal/Alertness: Awake/alert Behavior During Therapy: WFL for tasks assessed/performed Overall Cognitive Status: Impaired/Different from baseline Area of Impairment: Memory     Memory: Decreased recall of precautions (from beginning of session to the end)                        Home Living Family/patient expects to be discharged to:: Private residence Living Arrangements: Spouse/significant other;Children Available Help at Discharge: Family;Available 24 hours/day Type of Home: House Home Access: Stairs to enter Entergy CorporationEntrance Stairs-Number of Steps: 3 Entrance Stairs-Rails: Right;Left;Can reach both Home Layout: Able to live on main level with bedroom/bathroom     Bathroom Shower/Tub: Tub/shower unit;Door Shower/tub characteristics: Door  Bathroom Toilet: Standard     Home Equipment: None          Prior Functioning/Environment Level of Independence: Independent        Comments: pt very active; walks 3 miles daily    OT Diagnosis: Generalized weakness   OT Problem List: Decreased strength;Decreased activity  tolerance;Decreased range of motion;Impaired balance (sitting and/or standing);Decreased knowledge of precautions;Decreased knowledge of use of DME or AE   OT Treatment/Interventions: Self-care/ADL training;Patient/family education;Balance training;Therapeutic activities;DME and/or AE instruction    OT Goals(Current goals can be found in the care plan section) Acute Rehab OT Goals Patient Stated Goal: to get better and get back to [place] (pt was here with wife visiting one of their children when this occurred) OT Goal Formulation: With patient Time For Goal Achievement: 01/17/14 Potential to Achieve Goals: Good  OT Frequency: Min 2X/week              End of Session    Activity Tolerance: Patient tolerated treatment well Patient left: in bed;with call bell/phone within reach;with family/visitor present   Time: 1415-1430 OT Time Calculation (min): 15 min Charges:  OT General Charges $OT Visit: 1 Procedure OT Evaluation $Initial OT Evaluation Tier I: 1 Procedure OT Treatments $Self Care/Home Management : 8-22 mins  Evette GeorgesCatherine Eva Taj Nevins [phone] 01/10/2014, 5:34 PM

## 2014-01-10 NOTE — Progress Notes (Signed)
Patient ID: Joel Anderson, male   DOB: 18-Feb-1931, 80 y.o.   MRN: [id number] NEUROSURGERY PROGRESS NOTE  Doing well. Complains of appropriate back soreness and it is fairly mild. No leg pain, even with ambulation. No numbness, tingling or weakness in the legs  voiding well. The nurse and I walked with him in the hallways for short distance and he seemed to be doing quite well with minimal instability and minimal assistance Good strength and sensation Incision CDI  Temp:  [97.9 F (36.6 C)-98.6 F (37 C)] 98.1 F (36.7 C) (05/03 0939) Pulse Rate:  [65-87] 79 (05/03 0939) Resp:  [13-22] 18 (05/03 0939) BP: (107-165)/(64-101) 126/64 mmHg (05/03 0939) SpO2:  [95 %-100 %] 96 % (05/03 0939) Weight:  [83.915 kg (185 lb)-84.4 kg (186 lb 1.1 oz)] 84.4 kg (186 lb 1.1 oz) (05/02 2223)  Plan: Physical therapy for strengthening and ambulation. He is much better than preop. I still think he has some instability of gait requiring continued stay in the hospital for now.  Tia Alertavid S Areonna Bran, MD 01/10/2014 10:02 AM

## 2014-01-11 LAB — URINE CULTURE: Colony Count: >=100000

## 2014-01-11 MED ORDER — HYDROCODONE-ACETAMINOPHEN 5-325 MG PO TABS: 1.0000 | ORAL_TABLET | ORAL | Status: AC | PRN

## 2014-01-11 NOTE — Progress Notes (Addendum)
Occupational Therapy Treatment Patient Details Name: Lorane GellGordon Derner MRN: [id number] DOB: 23-Nov-1930 Today's Date: 01/11/2014    History of present illness Patient is a 80 y.o. male admitted for severe lumbar stenosis with acute onset of leg weakness.MRI or CT showed severe spinal stenosis at L3-4 with a right-sided disc protrusion and early severe lateral recess stenosis at L4-5 with multilevel spondylosis and degenerative disc disease. Pt is s/p L3-L5 lumbar laminectomy/decompression.    OT comments  Pt progressing towards goals. Education provided to pt and family during session and feel pt is safe to d/c home with spouse available to assist.  Follow Up Recommendations  No OT follow up    Equipment Recommendations  None recommended by OT    Recommendations for Other Services      Precautions / Restrictions Precautions Precautions: Back Precaution Comments: educated on back precautions        Mobility Bed Mobility Overal bed mobility: Needs Assistance Bed Mobility: Rolling;Sidelying to Sit;Sit to Sidelying Rolling: Supervision Sidelying to sit: Supervision     Sit to sidelying: Supervision General bed mobility comments: cues for log roll technique.  Transfers Overall transfer level: Needs assistance Equipment used: None Transfers: Sit to/from Stand Sit to Stand: Supervision         General transfer comment: educated on safe technique. Cues to try to keep back straight    Balance                                   ADL Overall ADL's : Needs assistance/impaired                     Lower Body Dressing: Minimal assistance;Sit to/from stand   Toilet Transfer: Min guard;Supervision/safety;Ambulation (chair/bed)       Tub/ Shower Transfer: Minimal assistance;Ambulation   Functional mobility during ADLs: Min guard;Supervision/safety General ADL Comments: Educated on AE for LB ADLs as well as explained that pt could perform LB dressing in supine.  Educated on use of toilet aid for hygiene if needed. Educated on use of cup for teeth care and placement of grooming items to avoid breaking precautions. Educated on safe shoe wear and to sit for dressing and stand in front of chair/bed when pulling up LB clothing. Practiced tub transfer. Recommended wife be with pt while he is up and moving.   Discussed sitting for bathing versus standing. Discussed having pillow behind pt when sitting and spouse asked about chair at home.       Vision                     Perception     Praxis      Cognition   Behavior During Therapy: [facility] tasks assessed/performed Overall Cognitive Status: Impaired/Different from baseline Area of Impairment: Memory                General Comments: practiced bed mobility a few times-pt forgetting technique.    Extremity/Trunk Assessment               Exercises     Shoulder Instructions       General Comments      Pertinent Vitals/ Pain      No pain reported.   Home Living  Prior Functioning/Environment              Frequency Min 2X/week     Progress Toward Goals  OT Goals(current goals can now be found in the care plan section)  Progress towards OT goals: Progressing toward goals  Acute Rehab OT Goals Patient Stated Goal: not stated OT Goal Formulation: With patient Time For Goal Achievement: 01/17/14 Potential to Achieve Goals: Good ADL Goals Pt Will Perform Lower Body Bathing: with set-up;with supervision;sit to/from stand;with adaptive equipment Pt Will Perform Lower Body Dressing: with set-up;with supervision;with adaptive equipment;sit to/from stand Pt Will Perform Tub/Shower Transfer: with supervision;Tub transfer;ambulating;shower seat Additional ADL Goal #1: Pt will be able to state 3/3 back precautions Additional ADL Goal #2: Pt will be Mod I in and OOB  Plan Discharge plan remains appropriate     Co-evaluation                 End of Session Equipment Utilized During Treatment: Gait belt   Activity Tolerance Patient tolerated treatment well   Patient Left in bed;with call bell/phone within reach;with family/visitor present   Nurse Communication          Time: [id number] OT Time Calculation (min): 29 min  Charges: OT General Charges $OT Visit: 1 Procedure OT Treatments $Self Care/Home Management : 8-22 mins $Therapeutic Activity: 8-22 mins  Earlie RavelingLindsey L Augusten Lipkin OTR/L [phone] 01/11/2014, 10:25 AM

## 2014-01-11 NOTE — Discharge Summary (Signed)
Physician Discharge Summary  Patient ID: Joel Anderson MRN: [id number] DOB/AGE: [birth year] 80 y.o.  Admit date: 01/09/2014 Discharge date: 01/11/2014  Admission Diagnoses: lumbar stenosis with leg weakness    Discharge Diagnoses: same   Discharged Condition: good  Hospital Course: The patient was admitted on 01/09/2014 and taken to the operating room where the patient underwent DLL with diskectomy L3-4, L4-5. The patient tolerated the procedure well and was taken to the recovery room and then to the floor in stable condition. The hospital course was routine. There were no complications. The wound remained clean dry and intact. Pt had appropriate back soreness. No complaints of leg pain or new N/T/W. The patient remained afebrile with stable vital signs, and tolerated a regular diet. The patient continued to increase activities with greatly improved ambulation, and pain was well controlled with oral pain medications.   Consults: None  Significant Diagnostic Studies:  Results for orders placed during the hospital encounter of 01/09/14  URINE CULTURE      Result Value Ref Range   Specimen Description URINE, CLEAN CATCH     Special Requests [id number]     Culture  Setup Time       Value: 01/09/2014 22:06     [facility]formed at [facility]   Colony Count       Value: >=100,000 COLONIES/ML     [facility]   Culture       Value: GRAM NEGATIVE RODS     [facility]formed at [facility]   Report Status PENDING    CBC      Result Value Ref Range   WBC 8.4  4.0 - 10.5 K/uL   RBC 4.97  4.22 - 5.81 MIL/uL   Hemoglobin 15.6  13.0 - 17.0 g/dL   HCT 91.445.5  78.239.0 - 95.652.0 %   MCV 91.5  78.0 - 100.0 fL   MCH 31.4  26.0 - 34.0 pg   MCHC 34.3  30.0 - 36.0 g/dL   RDW 21.313.1  08.611.5 - 57.815.5 %   Platelets 158  150 - 400 K/uL  BASIC METABOLIC PANEL      Result Value Ref Range   Sodium 143  137 - 147 mEq/L   Potassium 4.8  3.7 - 5.3 mEq/L   Chloride 103  96 - 112 mEq/L   CO2 25   19 - 32 mEq/L   Glucose, Bld 112 (*) 70 - 99 mg/dL   BUN 19  6 - 23 mg/dL   Creatinine, Ser 4.690.94  0.50 - 1.35 mg/dL   Calcium 9.3  8.4 - 62.910.5 mg/dL   GFR calc non Af Amer 77 (*) >90 mL/min   GFR calc Af Amer 89 (*) >90 mL/min  URINALYSIS, ROUTINE W REFLEX MICROSCOPIC      Result Value Ref Range   Color, Urine YELLOW  YELLOW   APPearance CLOUDY (*) CLEAR   Specific Gravity, Urine 1.008  1.005 - 1.030   pH 7.0  5.0 - 8.0   Glucose, UA NEGATIVE  NEGATIVE mg/dL   Hgb urine dipstick NEGATIVE  NEGATIVE   Bilirubin Urine NEGATIVE  NEGATIVE   Ketones, ur NEGATIVE  NEGATIVE mg/dL   Protein, ur NEGATIVE  NEGATIVE mg/dL   Urobilinogen, UA 0.2  0.0 - 1.0 mg/dL   Nitrite POSITIVE (*) NEGATIVE   Leukocytes, UA SMALL (*) NEGATIVE  URINE MICROSCOPIC-ADD ON      Result Value Ref Range   WBC, UA 3-6  <3 WBC/hpf  Bacteria, UA MANY (*) RARE  I-STAT TROPOININ, ED      Result Value Ref Range   Troponin i, poc 0.00  0.00 - 0.08 ng/mL   Comment 3             Ct Head Wo Contrast  01/09/2014   CLINICAL DATA:  Difficulty walking.  Weakness.  EXAM: CT HEAD WITHOUT CONTRAST  TECHNIQUE: Contiguous axial images were obtained from the base of the skull through the vertex without intravenous contrast.  COMPARISON:  None.  FINDINGS: Diffuse cerebral atrophy is present. No mass. No hydrocephalus. No hemorrhage. Orbits are intact. No acute bony abnormality.  IMPRESSION: Diffuse cerebral atrophy, no acute abnormality.   Electronically Signed   By: Maisie Fushomas  Register   On: 01/09/2014 14:21   Mr Lumbar Spine Wo Contrast  01/09/2014   CLINICAL DATA:  Low back pain with bilateral hip and thigh pain. Possible spinal stenosis.  EXAM: MRI LUMBAR SPINE WITHOUT CONTRAST  TECHNIQUE: Multiplanar, multisequence MR imaging of the lumbar spine was performed. No intravenous contrast was administered.  COMPARISON:  CT head obtained earlier today was unremarkable.  FINDINGS: Anatomic alignment. No worrisome osseous lesions.Moderate to  severe disc space narrowing at all levels, probably worst at L5-S1. Endplate reactive changes reflect chronic loss of interspace height at L3-4, L4-5, and L5-S1. Normal conus ending upper aspect of L1. No paravertebral or retroperitoneal masses.  The individual disc spaces were examined with axial images as follows:  L1-L2: Mild annular bulging. Mild facet arthropathy. No impingement.  L2-L3: Mild annular bulging. Mild facet arthropathy. No impingement.  L3-L4: Central disc extrusion slightly eccentric to the right, associated with facet arthropathy and ligamentum flavum hypertrophy. Critical spinal stenosis is present with only 2-3 mm of measurable Canal diameter. Significant compression of the cauda equina nerve roots. Bilateral foraminal narrowing may be slightly worse on the left. Bilateral L3 and L4 nerve root impingement is noted.  L4-L5: Central disc protrusion with moderate facet and ligamentum flavum hypertrophy. Moderate to severe central canal stenosis. Bilateral L5 and L4 nerve root impingement, may be slightly worse on the right.  L5-S1: Mild bulge. Mild osteophytic spurring. Mild facet arthropathy. No S1 nerve root impingement in the canal. Bilateral neural foraminal narrowing is worse on the left.  IMPRESSION: Critical spinal stenosis at L3-L4. Significant compression of the cauda equina nerve roots is observed, slightly worse on the right. Surgical consultation with regard to decompression may be warranted, in the setting of leg weakness.  Moderate to severe stenosis at L4-5.  Bilateral foraminal narrowing can be seen from L3 through S1, multifactorial in nature, related to disc material, posterior element hypertrophy, and disc space narrowing; see comments above.   Electronically Signed   By: Davonna BellingJohn  Curnes M.D.   On: 01/09/2014 16:32   Dg Lumbar Spine 1 View  01/09/2014   CLINICAL DATA:  Lateral lumbar spine image for surgical localization.  EXAM: LUMBAR SPINE - 1 VIEW  COMPARISON:  Lumbar MRI,  01/09/2014  FINDINGS: Skin retractors lie posterior to the L3-L4 level. A probe inserted between the skin retractors has its tip posterior to the upper aspect of the L4 vertebrae, below the L3-4 disc space level.  IMPRESSION: Lateral lumbar spine image for surgical localization.   Electronically Signed   By: Amie Portlandavid  Ormond M.D.   On: 01/09/2014 19:38   Dg Chest Port 1 View  01/09/2014   CLINICAL DATA:  Lumbar laminectomy.  Hypertension.  EXAM: PORTABLE CHEST - 1 VIEW  COMPARISON:  None.  FINDINGS: Mediastinum and hilar structures normal. Subsegmental atelectasis left lung base. No pleural effusion or pneumothorax. Degenerative changes both shoulders and thoracic spine.  IMPRESSION: Mild subsegmental atelectasis versus scarring left lung base.   Electronically Signed   By: Maisie Fus  Register   On: 01/09/2014 17:54    Antibiotics:  Anti-infectives   Start     Dose/Rate Route Frequency Ordered Stop   01/09/14 2230  ceFAZolin (ANCEF) IVPB 1 g/50 mL premix     1 g 100 mL/hr over 30 Minutes Intravenous Every 8 hours 01/09/14 2217 01/10/14 0618   01/09/14 1909  bacitracin 50,000 Units in sodium chloride irrigation 0.9 % 500 mL irrigation  Status:  Discontinued       As needed 01/09/14 1929 01/09/14 2116   01/09/14 1745  cefTRIAXone (ROCEPHIN) injection 1 g     1 g Intramuscular  Once 01/09/14 1739 01/09/14 1800      Discharge Exam: Blood pressure 129/65, pulse 59, temperature 98.3 F (36.8 C), temperature source Oral, resp. rate 20, height 5\' 8"  (1.727 m), weight 84.4 kg (186 lb 1.1 oz), SpO2 99.00%. Neurologic: Grossly normal, walking much better, R DF 4-/5, L DF 4/5 Incision CDI  Discharge Medications:     Medication List         enalapril 5 MG tablet  Commonly known as:  VASOTEC  Take 5 mg by mouth daily.     HYDROcodone-acetaminophen 5-325 MG per tablet  Commonly known as:  NORCO/VICODIN  Take 1-2 tablets by mouth every 4 (four) hours as needed for moderate pain.     naproxen sodium  220 MG tablet  Commonly known as:  ANAPROX  Take 440 mg by mouth 2 (two) times daily with a meal.     trolamine salicylate 10 % cream  Commonly known as:  ASPERCREME  Apply 1 application topically as needed for muscle pain (right hip).        Disposition: home   Final Dx: DLL L3-4, L4-5       Discharge Orders   Future Orders Complete By Expires   Call MD for:  difficulty breathing, headache or visual disturbances  As directed    Call MD for:  persistant nausea and vomiting  As directed    Call MD for:  redness, tenderness, or signs of infection (pain, swelling, redness, odor or green/yellow discharge around incision site)  As directed    Call MD for:  severe uncontrolled pain  As directed    Call MD for:  temperature >100.4  As directed    Diet - low sodium heart healthy  As directed    Discharge instructions  As directed    Increase activity slowly  As directed    Remove dressing in 24 hours  As directed       Follow-up Information   Follow up with Taffy Delconte S, MD. Schedule an appointment as soon as possible for a visit in 2 weeks.   Specialty:  Neurosurgery   Contact information:   [address] [place] [address] [phone]        Signed: Tia Alert 01/11/2014, 1:18 PM

## 2014-01-11 NOTE — Progress Notes (Signed)
Physical Therapy Treatment Patient Details Name: Joel Anderson MRN: [id number] DOB: [birth year] Today's Date: 01/11/2014    History of Present Illness Patient is a 80 y.o. male admitted for severe lumbar stenosis with acute onset of leg weakness.MRI or CT showed severe spinal stenosis at L3-4 with a right-sided disc protrusion and early severe lateral recess stenosis at L4-5 with multilevel spondylosis and degenerative disc disease. Pt is s/p L3-L5 lumbar laminectomy/decompression.     PT Comments    Pt admitted with above. Pt currently with functional limitations due to balance and endurance deficits.  Son to stop and get a cane for pt for home use.  Answerred all of pt's wifes and son's questions re: mobility, equipment, f/u and precautions.  Pt will need HHPT f/u for safety evaluation at son's home and to practice further on steps for eventual return to home in [place].    Pt will benefit from skilled PT to increase their independence and safety with mobility to allow discharge to the venue listed below.   Follow Up Recommendations  Supervision for mobility/OOB;Home health PT     Equipment Recommendations  Cane    Recommendations for Other Services OT consult     Precautions / Restrictions Precautions Precautions: Back Precaution Booklet Issued: Yes (comment) Precaution Comments: educated on back precautions  Restrictions Weight Bearing Restrictions: No    Mobility  Bed Mobility Overal bed mobility: Needs Assistance Bed Mobility: Rolling;Sidelying to Sit Rolling: Supervision Sidelying to sit: Supervision       General bed mobility comments: cues for log roll technique.  Transfers Overall transfer level: Needs assistance Equipment used: None Transfers: Sit to/from Stand Sit to Stand: Supervision         General transfer comment: educated on safe technique. Cues to try to keep back straight  Ambulation/Gait Ambulation/Gait assistance: Min guard Ambulation Distance  (Feet): 250 Feet Assistive device: None Gait Pattern/deviations: Step-through pattern;Decreased stride length;Wide base of support Gait velocity: guarded due to pain  Gait velocity interpretation: Below normal speed for age/gender General Gait Details: min guard due to occasional unsteadiness.  Discussed that pt may need to pick up a cane to use.  Discussed how to adjust the cane with wife and son.     Stairs Stairs: Yes Stairs assistance: Min assist Stair Management: No rails;Step to pattern;Forwards Number of Stairs: 5 General stair comments: Pt had 2 LOB on stairs requiring min assist to steady.  Lost balance posteriorly when descending steps needing steadying assist.  Son and wife aware of pts need to use rails or have someone with him on steps.    Wheelchair Mobility    Modified Rankin (Stroke Patients Only)       Balance Overall balance assessment: Needs assistance;History of Falls Sitting-balance support: No upper extremity supported;Feet supported Sitting balance-Leahy Scale: Good   Postural control: Posterior lean Standing balance support: No upper extremity supported;During functional activity Standing balance-Leahy Scale: Fair Standing balance comment: Able to accept weightshift minimally and when outside BOS, would lose balance posteriorly.               High level balance activites: Turns;Sudden stops;Direction changes High Level Balance Comments: Pt needs min guard assist for ambulation with high level activities as he is slightly unsteady - recommended to pt that he use cane.      Cognition Arousal/Alertness: Awake/alert Behavior During Therapy: WFL for tasks assessed/performed Overall Cognitive Status: Impaired/Different from baseline Area of Impairment: Memory  Exercises General Exercises - Lower Extremity Ankle Circles/Pumps: AROM;Both;10 reps Heel Slides: AROM;Both;10 reps;Supine    General Comments General comments  (skin integrity, edema, etc.): Discussed d/c plans at length with pt, wife and son.  Son to get cane.  They had questions about pts dressing for back and deferred the questions to nursing.  Discussed back precautions and sitting precautions and gave pt handout.       Pertinent Vitals/Pain VSS, No pain    Home Living                      Prior Function            PT Goals (current goals can now be found in the care plan section) Progress towards PT goals: Progressing toward goals    Frequency  Min 5X/week    PT Plan Current plan remains appropriate    Co-evaluation             End of Session Equipment Utilized During Treatment: Gait belt Activity Tolerance: Patient tolerated treatment well Patient left: in chair;with call bell/phone within reach;with family/visitor present     Time: 1126-1200 PT Time Calculation (min): 34 min  Charges:  $Gait Training: 8-22 mins $Self Care/Home Management: 8-22                    G Codes:      Joel Anderson Joel Anderson 01/11/2014, 2:40 PM [facility],PT Acute Rehabilitation [phone] (pager)

## 2014-01-11 NOTE — Progress Notes (Signed)
Pt A&O x4; family at bedside and pt education and instructions completed with pt and family. All voices understanding and denies any questions. All lines including pt IV removed; pt dsg remained intact and pt informed to remove dsg in 24hrs as ordered by MD. Pt given his ordered prescription for vicodin. Pt given a print-out education on diskectomy aftercare. Pt transported off unit via wheelchair with family and belongings at side. Arabella MerlesP. Amo Kenadee Gates RN.

## 2014-01-12 ENCOUNTER — Encounter (HOSPITAL_COMMUNITY): Payer: Self-pay | Admitting: Neurological Surgery

## 2014-01-12 NOTE — ED Provider Notes (Signed)
Medical screening examination/treatment/procedure(s) were conducted as a shared visit with non-physician practitioner(s) and myself.  I personally evaluated the patient during the encounter.   EKG Interpretation   Date/Time:  Saturday Jan 09 2014 14:09:36 EDT Ventricular Rate:  71 PR Interval:  126 QRS Duration: 109 QT Interval:  413 QTC Calculation: 449 R Axis:   -24 Text Interpretation:  Sinus rhythm Borderline left axis deviation Abnormal  R-wave progression, early transition Nonspecific T abnormalities, lateral  leads ED PHYSICIAN INTERPRETATION AVAILABLE IN [facility] Confirmed  by TEST, Record ([id number]) on 01/11/2014 11:19:38 AM     Patient with leg weakness and unsteadiness. Ct head reassuring MR back showed cauda equina. Weak reflexes on my exam. Will admit  Juliet Rudeathan R. Rubin PayorPickering, MD 01/12/14 [phone]

## 2014-03-05 NOTE — OR Nursing (Signed)
Addendum to scope page and wound class.  

## 2014-10-29 IMAGING — CT CT HEAD W/O CM
1 series · 16 of 29 positions shown, 20 images · non-contrast
Comparison: None.

CLINICAL DATA: Difficulty walking.  Weakness.

EXAM:
CT HEAD WITHOUT CONTRAST
TECHNIQUE: Contiguous axial images were obtained from the base of the skull
through the vertex without intravenous contrast.

[Series 2: head 5.0 h30s · axial · 0.46mm/px · z∈[-111,+19]mm · 16 of 29 slices shown, 20 images]
[im 2/29  brain]
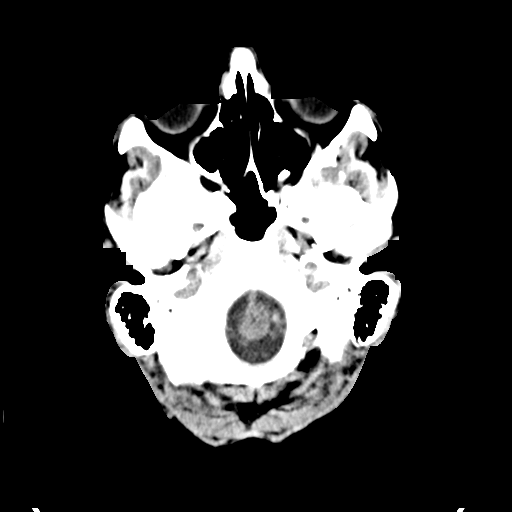
[im 2/29  bone]
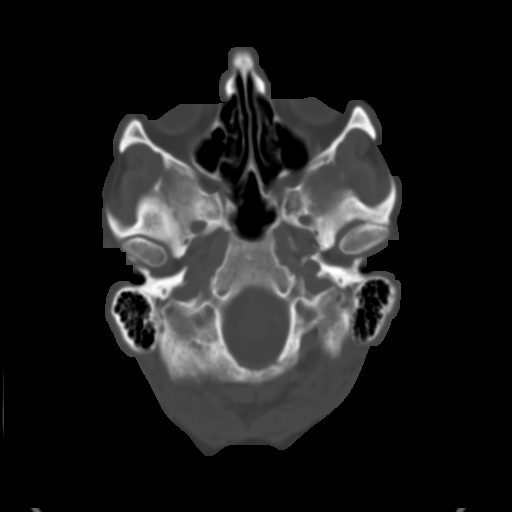
[im 4/29  brain]
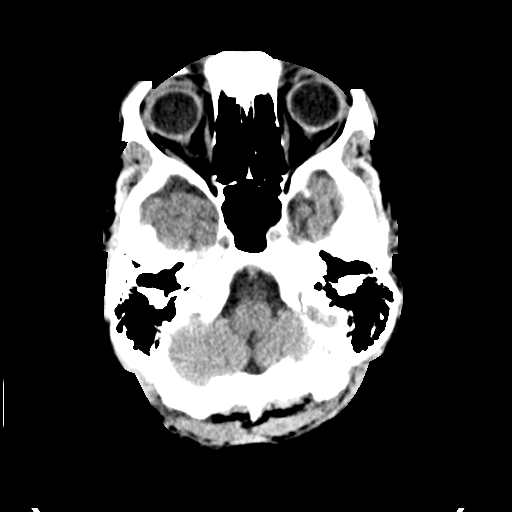
[im 6/29  brain]
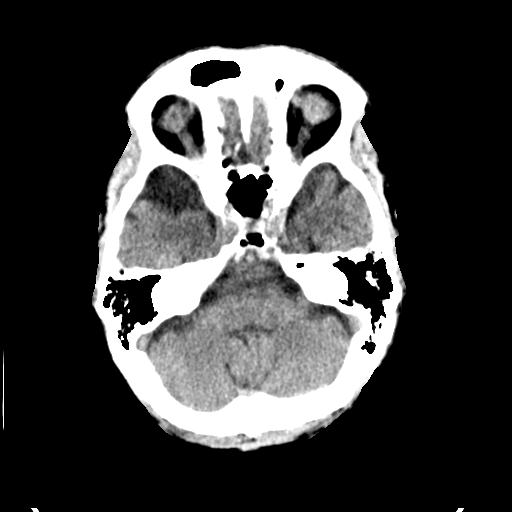
[im 7/29  brain]
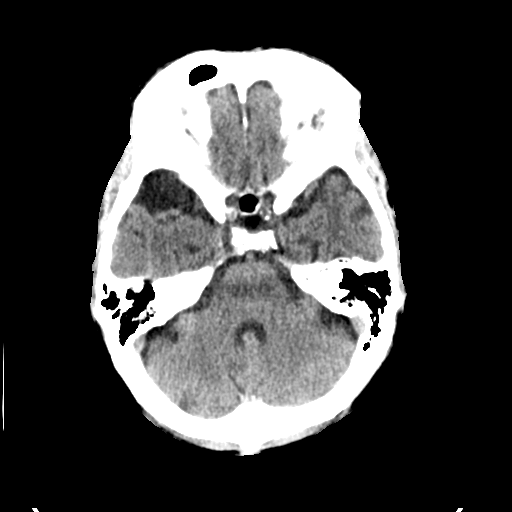
[im 9/29  brain]
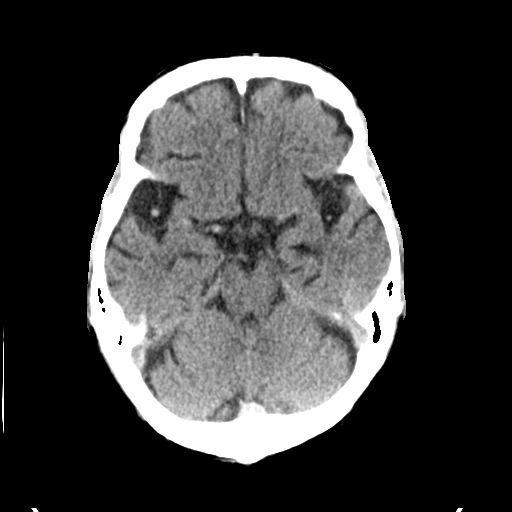
[im 9/29  bone]
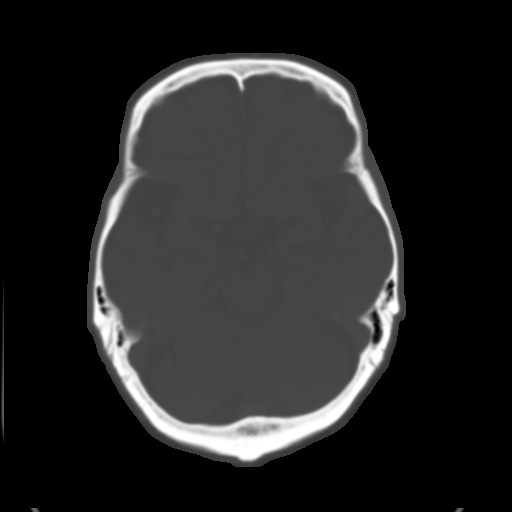
[im 11/29  brain]
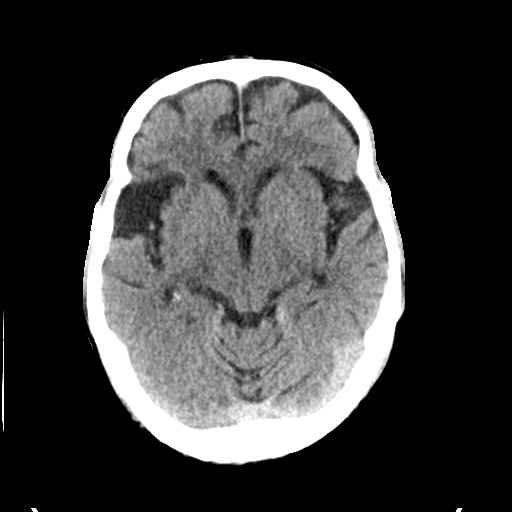
[im 12/29  brain]
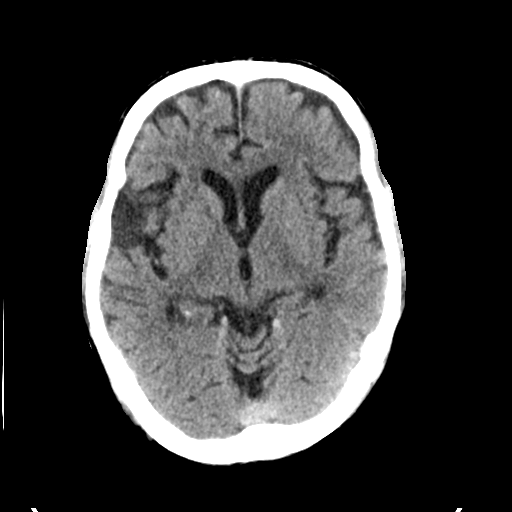
[im 14/29  brain]
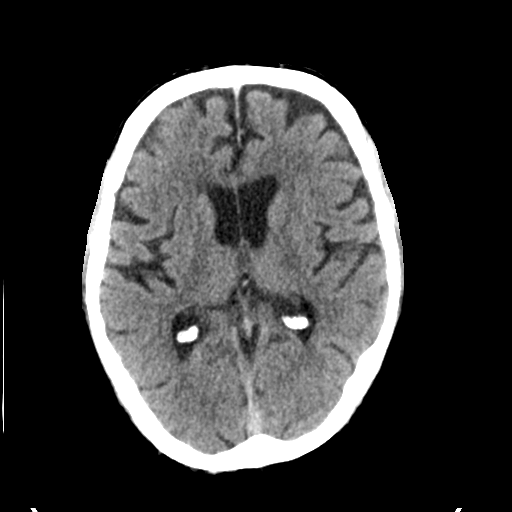
[im 16/29  brain]
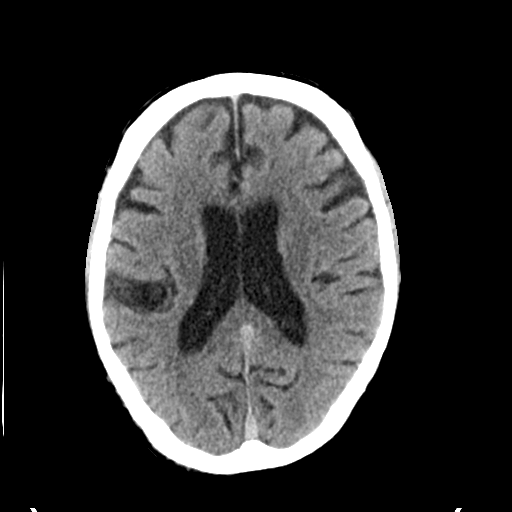
[im 16/29  bone]
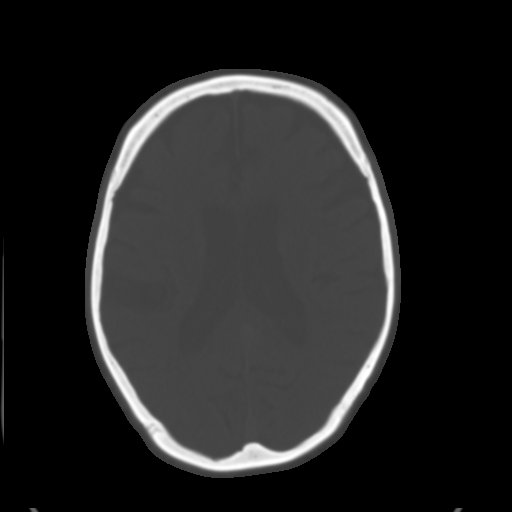
[im 18/29  brain]
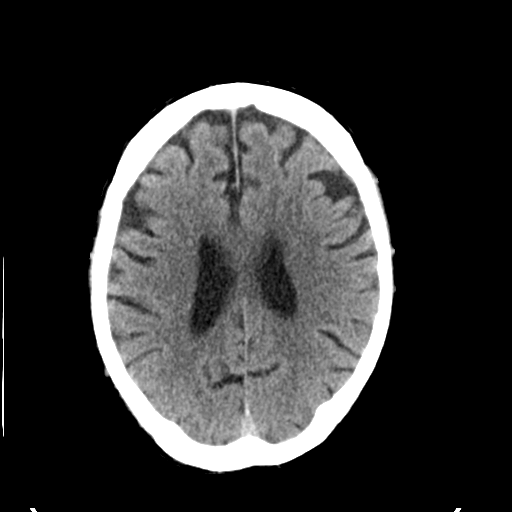
[im 19/29  brain]
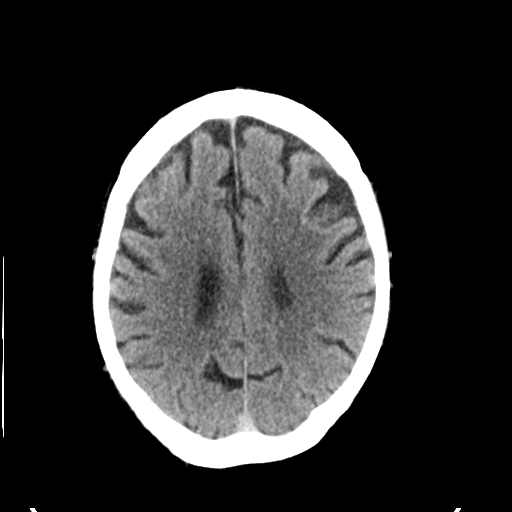
[im 21/29  brain]
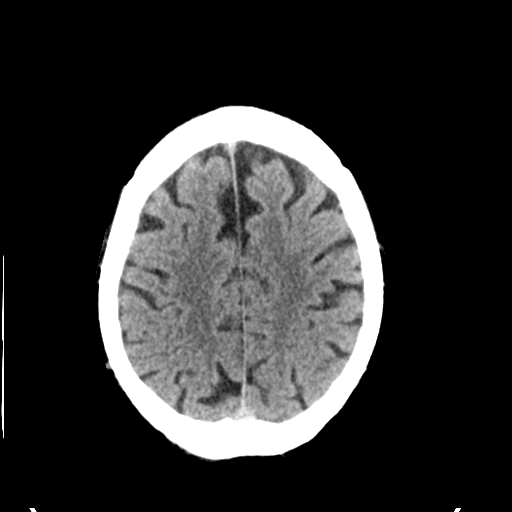
[im 23/29  brain]
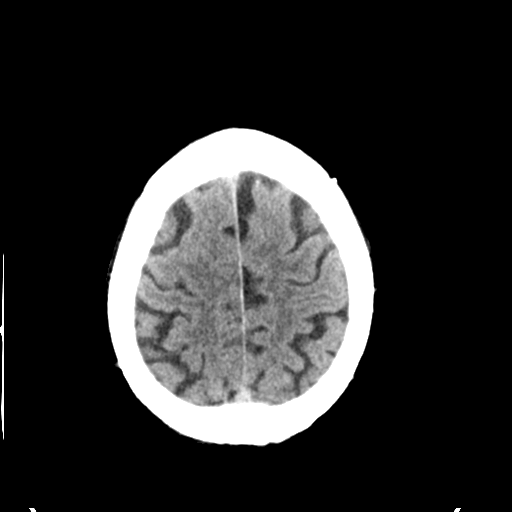
[im 23/29  bone]
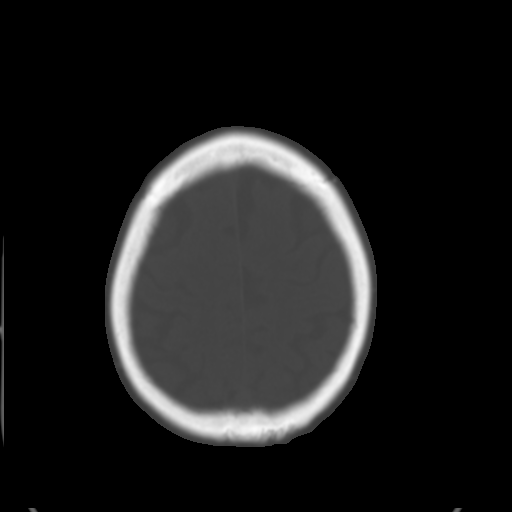
[im 24/29  brain]
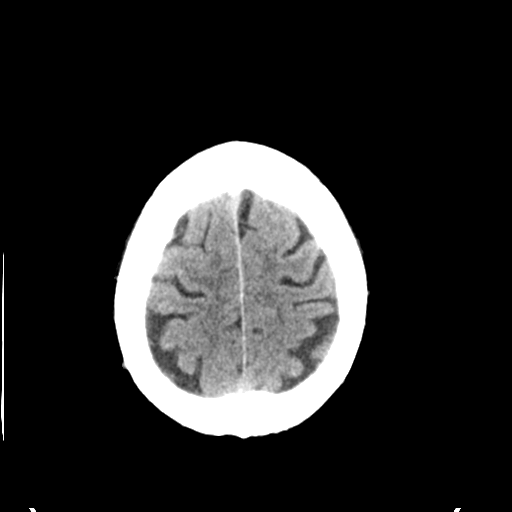
[im 26/29  brain]
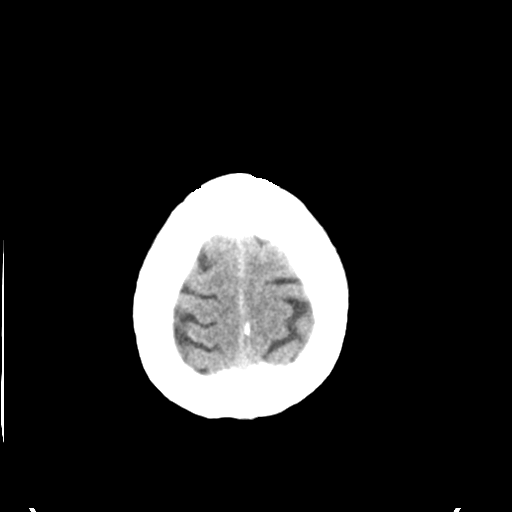
[im 28/29  brain]
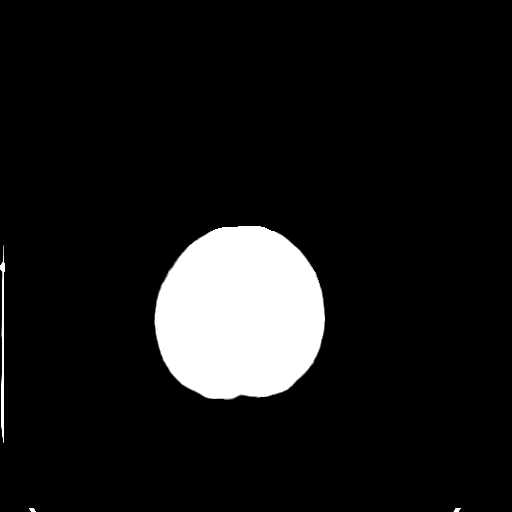

[16 of 29 positions shown; findings below may reference images not displayed]

FINDINGS: Diffuse cerebral atrophy is present. No mass. No hydrocephalus. No
hemorrhage. Orbits are intact. No acute bony abnormality.
IMPRESSION: Diffuse cerebral atrophy, no acute abnormality.

## 2022-09-10 DEATH — deceased
